# Patient Record
Sex: Female | Born: 1995 | Race: Black or African American | Hispanic: No | Marital: Single | Smoking: Never smoker
Health system: Southern US, Community
[De-identification: ages and names within clinical notes are randomized; demographics above are authoritative.]

---

## 2015-03-26 ENCOUNTER — Encounter: Payer: Self-pay | Admitting: Adult Health

## 2015-04-08 ENCOUNTER — Encounter: Payer: Self-pay | Admitting: Adult Health

## 2016-02-11 ENCOUNTER — Emergency Department (HOSPITAL_COMMUNITY): Admission: EM | Admit: 2016-02-11 | Discharge: 2016-02-11 | Discharge status: Absent without leave | Payer: Self-pay

## 2016-03-13 ENCOUNTER — Emergency Department (HOSPITAL_COMMUNITY)
Admission: EM | Admit: 2016-03-13 | Discharge: 2016-03-13 | Disposition: A | Discharge status: Home / Self Care | Payer: Medicaid Other | Attending: Emergency Medicine | Admitting: Emergency Medicine

## 2016-03-13 ENCOUNTER — Encounter (HOSPITAL_COMMUNITY): Payer: Self-pay

## 2016-03-13 DIAGNOSIS — Z5321 Procedure and treatment not carried out due to patient leaving prior to being seen by health care provider: Secondary | ICD-10-CM | POA: Insufficient documentation

## 2016-03-13 DIAGNOSIS — R112 Nausea with vomiting, unspecified: Secondary | ICD-10-CM | POA: Insufficient documentation

## 2016-03-13 DIAGNOSIS — M545 Low back pain: Secondary | ICD-10-CM | POA: Diagnosis present

## 2016-03-13 LAB — COMPREHENSIVE METABOLIC PANEL
ALT: 16 U/L (ref 14–54)
ANION GAP: 6 (ref 5–15)
AST: 18 U/L (ref 15–41)
Albumin: 4 g/dL (ref 3.5–5.0)
Alkaline Phosphatase: 63 U/L (ref 38–126)
BILIRUBIN TOTAL: 0.6 mg/dL (ref 0.3–1.2)
BUN: 11 mg/dL (ref 6–20)
CHLORIDE: 108 mmol/L (ref 101–111)
CO2: 24 mmol/L (ref 22–32)
Calcium: 9.3 mg/dL (ref 8.9–10.3)
Creatinine, Ser: 0.71 mg/dL (ref 0.44–1.00)
Glucose, Bld: 92 mg/dL (ref 65–99)
POTASSIUM: 3.9 mmol/L (ref 3.5–5.1)
Sodium: 138 mmol/L (ref 135–145)
TOTAL PROTEIN: 7 g/dL (ref 6.5–8.1)

## 2016-03-13 LAB — URINALYSIS, ROUTINE W REFLEX MICROSCOPIC
Glucose, UA: NEGATIVE mg/dL
Hgb urine dipstick: NEGATIVE
KETONES UR: NEGATIVE mg/dL
LEUKOCYTES UA: NEGATIVE
NITRITE: NEGATIVE
PH: 5.5 (ref 5.0–8.0)
PROTEIN: NEGATIVE mg/dL
Specific Gravity, Urine: 1.038 — ABNORMAL HIGH (ref 1.005–1.030)

## 2016-03-13 LAB — CBC
HEMATOCRIT: 39.7 % (ref 36.0–46.0)
Hemoglobin: 12.6 g/dL (ref 12.0–15.0)
MCH: 28.4 pg (ref 26.0–34.0)
MCHC: 31.7 g/dL (ref 30.0–36.0)
MCV: 89.4 fL (ref 78.0–100.0)
Platelets: 334 10*3/uL (ref 150–400)
RBC: 4.44 MIL/uL (ref 3.87–5.11)
RDW: 13.7 % (ref 11.5–15.5)
WBC: 8 10*3/uL (ref 4.0–10.5)

## 2016-03-13 LAB — I-STAT BETA HCG BLOOD, ED (MC, WL, AP ONLY): I-stat hCG, quantitative: <5 m[IU]/mL (ref <5)

## 2016-03-13 LAB — LIPASE, BLOOD: LIPASE: 33 U/L (ref 11–51)

## 2016-03-13 MED ORDER — ONDANSETRON 4 MG PO TBDP
ORAL_TABLET | ORAL | Status: AC
  Filled 2016-03-13: qty 1

## 2016-03-13 MED ORDER — ONDANSETRON 4 MG PO TBDP
4.0000 mg | ORAL_TABLET | Freq: Once | ORAL | Status: AC | PRN
  Administered 2016-03-13: 4 mg via ORAL

## 2016-03-13 NOTE — ED Notes (Signed)
Patient called 3x no response.  

## 2016-03-13 NOTE — ED Triage Notes (Signed)
Pt c/o lower back pain that has been ongoing for several weeks pt recently stopped birth control unsure of pregnancy status pt also c/o some nausea pt also c/o increased gas and some diarrhea

## 2016-03-13 NOTE — ED Notes (Signed)
Patient called 3x, no response °

## 2016-06-21 ENCOUNTER — Encounter (HOSPITAL_COMMUNITY): Payer: Self-pay | Admitting: Emergency Medicine

## 2016-06-21 ENCOUNTER — Emergency Department (HOSPITAL_COMMUNITY)
Admission: EM | Admit: 2016-06-21 | Discharge: 2016-06-22 | Disposition: A | Discharge status: Home / Self Care | Payer: Self-pay | Attending: Emergency Medicine | Admitting: Emergency Medicine

## 2016-06-21 DIAGNOSIS — K5900 Constipation, unspecified: Secondary | ICD-10-CM | POA: Insufficient documentation

## 2016-06-21 DIAGNOSIS — N9489 Other specified conditions associated with female genital organs and menstrual cycle: Secondary | ICD-10-CM | POA: Insufficient documentation

## 2016-06-21 LAB — COMPREHENSIVE METABOLIC PANEL
ALT: 15 U/L (ref 14–54)
AST: 17 U/L (ref 15–41)
Albumin: 3.9 g/dL (ref 3.5–5.0)
Alkaline Phosphatase: 71 U/L (ref 38–126)
Anion gap: 8 (ref 5–15)
BILIRUBIN TOTAL: 0.4 mg/dL (ref 0.3–1.2)
BUN: 8 mg/dL (ref 6–20)
CHLORIDE: 103 mmol/L (ref 101–111)
CO2: 26 mmol/L (ref 22–32)
CREATININE: 0.8 mg/dL (ref 0.44–1.00)
Calcium: 9.5 mg/dL (ref 8.9–10.3)
GFR calc Af Amer: >60 mL/min (ref >60)
Glucose, Bld: 77 mg/dL (ref 65–99)
Potassium: 4.1 mmol/L (ref 3.5–5.1)
Sodium: 137 mmol/L (ref 135–145)
TOTAL PROTEIN: 6.9 g/dL (ref 6.5–8.1)

## 2016-06-21 LAB — URINALYSIS, ROUTINE W REFLEX MICROSCOPIC
BILIRUBIN URINE: NEGATIVE
GLUCOSE, UA: NEGATIVE mg/dL
Hgb urine dipstick: NEGATIVE
KETONES UR: NEGATIVE mg/dL
LEUKOCYTES UA: NEGATIVE
Nitrite: NEGATIVE
PROTEIN: NEGATIVE mg/dL
Specific Gravity, Urine: 1.005 (ref 1.005–1.030)
pH: 7 (ref 5.0–8.0)

## 2016-06-21 LAB — LIPASE, BLOOD: Lipase: 27 U/L (ref 11–51)

## 2016-06-21 LAB — HCG, QUANTITATIVE, PREGNANCY: hCG, Beta Chain, Quant, S: <1 m[IU]/mL (ref <5)

## 2016-06-21 LAB — CBC
HCT: 41.9 % (ref 36.0–46.0)
Hemoglobin: 13.6 g/dL (ref 12.0–15.0)
MCH: 28.3 pg (ref 26.0–34.0)
MCHC: 32.5 g/dL (ref 30.0–36.0)
MCV: 87.3 fL (ref 78.0–100.0)
PLATELETS: 393 10*3/uL (ref 150–400)
RBC: 4.8 MIL/uL (ref 3.87–5.11)
RDW: 13.1 % (ref 11.5–15.5)
WBC: 6.7 10*3/uL (ref 4.0–10.5)

## 2016-06-21 NOTE — ED Triage Notes (Signed)
Patient reports left lateral abdominal pain with nausea onset 2 days ago , pain increases when lying on affected side , denies diarrhea or fever.

## 2016-06-22 ENCOUNTER — Emergency Department (HOSPITAL_COMMUNITY): Payer: Self-pay

## 2016-06-22 MED ORDER — POLYETHYLENE GLYCOL 3350 17 G PO PACK: 17.0000 g | PACK | Freq: Every day | ORAL | 0 refills | Status: AC

## 2016-06-22 MED ORDER — OXYCODONE-ACETAMINOPHEN 5-325 MG PO TABS
1.0000 | ORAL_TABLET | Freq: Once | ORAL | Status: AC
  Administered 2016-06-22: 1 via ORAL
  Filled 2016-06-22: qty 1

## 2016-06-22 MED ORDER — ONDANSETRON 4 MG PO TBDP
4.0000 mg | ORAL_TABLET | Freq: Once | ORAL | Status: AC
  Administered 2016-06-22: 4 mg via ORAL
  Filled 2016-06-22: qty 1

## 2016-06-22 NOTE — ED Notes (Signed)
Pt verbalized understanding of d/c instructions and has no further questions. Pt is stable, A&Ox4, VSS.  

## 2016-06-22 NOTE — ED Provider Notes (Signed)
[facility]-EMERGENCY DEPT Provider Note   CSN: [id number] Arrival date & time: 06/21/16  1925  History   Chief Complaint Chief Complaint  Patient presents with  . Abdominal Pain    HPI Vickie Berry is a 20 y.o. female.  HPI   Patient who is otherwise healthy comes to the ER with complaints of left side pain. She has not been having normal bowel movements and recently started working out. She says that her side pain has been persisting for a few months. She came in today because she was laying down and found it to be uncomfortable to lay on and wanted to be evalauted. She has not had V/D fever, dysuria, vaginal discharge back pain, headache. No other associated symptoms aside from nausea.  History reviewed. No pertinent past medical history.  There are no active problems to display for this patient.   History reviewed. No pertinent surgical history.  OB History    No data available       Home Medications    Prior to Admission medications   Medication Sig Start Date End Date Taking? Authorizing Provider  polyethylene glycol (MIRALAX / GLYCOLAX) packet Take 17 g by mouth daily. 06/22/16   Marlon Peliffany Floride Hutmacher, PA-C    Family History No family history on file.  Social History Social History  Substance Use Topics  . Smoking status: Never Smoker  . Smokeless tobacco: Never Used  . Alcohol use No     Allergies   Patient has no known allergies.   Review of Systems Review of Systems  Review of Systems All other systems negative except as documented in the HPI. All pertinent positives and negatives as reviewed in the HPI.  Physical Exam Updated Vital Signs BP 124/71   Pulse 67   Temp 98.5 F (36.9 C) (Oral)   Resp 16   Ht 5\' 9"  (1.753 m)   Wt 95.3 kg   LMP 05/14/2016 (Approximate)   SpO2 100%   BMI 31.01 kg/m   Physical Exam  Constitutional: She appears well-developed and well-nourished.  HENT:  Head: Normocephalic and atraumatic.  Eyes: Conjunctivae are  normal. Pupils are equal, round, and reactive to light.  Neck: Trachea normal, normal range of motion and full passive range of motion without pain. Neck supple.  Cardiovascular: Normal rate, regular rhythm and normal pulses.   Pulmonary/Chest: Effort normal and breath sounds normal. Chest wall is not dull to percussion. She exhibits no tenderness, no crepitus, no edema, no deformity and no retraction.  Abdominal: Soft. Normal appearance and bowel sounds are normal. She exhibits no distension and no ascites. There is generalized tenderness (mild). There is no rigidity, no guarding, no tenderness at McBurney's point and negative Murphy's sign.    Musculoskeletal: Normal range of motion.  Neurological: She is alert. She has normal strength.  Skin: Skin is warm, dry and intact.  Psychiatric: She has a normal mood and affect. Her speech is normal and behavior is normal. Judgment and thought content normal. Cognition and memory are normal.    ED Treatments / Results  Labs (all labs ordered are listed, but only abnormal results are displayed) Labs Reviewed  LIPASE, BLOOD  COMPREHENSIVE METABOLIC PANEL  CBC  URINALYSIS, ROUTINE W REFLEX MICROSCOPIC  HCG, QUANTITATIVE, PREGNANCY    EKG  EKG Interpretation None       Radiology Dg Abdomen 1 View  Result Date: 06/22/2016 CLINICAL DATA:  Subacute onset of generalized abdominal pain. Initial encounter. EXAM: ABDOMEN - 1 VIEW COMPARISON:  None.  FINDINGS: The visualized bowel gas pattern is unremarkable. Scattered air and stool filled loops of colon are seen; no abnormal dilatation of small bowel loops is seen to suggest small bowel obstruction. No free intra-abdominal air is identified, though evaluation for free air is limited on a single supine view. The visualized osseous structures are within normal limits; the sacroiliac joints are unremarkable in appearance. I  MPRESSION: Unremarkable bowel gas pattern; no free intra-abdominal air seen.  Moderate amount of stool noted in the colon. Electronically Signed   By: Roanna RaiderJeffery  Chang M.D.   On: 06/22/2016 01:12    Procedures Procedures (including critical care time)  Medications Ordered in ED Medications  ondansetron (ZOFRAN-ODT) disintegrating tablet 4 mg (4 mg Oral Given 06/22/16 0034)  oxyCODONE-acetaminophen (PERCOCET/ROXICET) 5-325 MG per tablet 1 tablet (1 tablet Oral Given 06/22/16 0043)     Initial Impression / Assessment and Plan / ED Course  I have reviewed the triage vital signs and the nursing notes.  Pertinent labs & imaging results that were available during my care of the patient were reviewed by me and considered in my medical decision making (see chart for details).  Clinical Course     Patient is nontoxic, nonseptic appearing, in no apparent distress.  Patient's pain and other symptoms adequately managed in emergency department.  Fluid bolus given.  Labs, imaging and vitals reviewed.  Patient does not meet the SIRS or Sepsis criteria.  On repeat exam patient does not have a surgical abdomin and there are no peritoneal signs.  No indication of appendicitis, bowel obstruction, bowel perforation, cholecystitis, diverticulitis,ectopic pregnancy.  Patient discharged home with symptomatic treatment and given strict instructions for follow-up with their primary care physician.  I have also discussed reasons to return immediately to the ER.  Patient expresses understanding and agrees with plan.   Final Clinical Impressions(s) / ED Diagnoses   Final diagnoses:  Constipation, unspecified constipation type    New Prescriptions New Prescriptions   POLYETHYLENE GLYCOL (MIRALAX / GLYCOLAX) PACKET    Take 17 g by mouth daily.     Marlon Peliffany Jaiyla Granados, PA-C 06/22/16 [id number]    Azalia BilisKevin Campos, MD 06/22/16 [phone]

## 2017-05-01 IMAGING — DX DG ABDOMEN 1V
1 series · 1 of 1 positions shown · non-contrast
Comparison: None.

CLINICAL DATA: Subacute onset of generalized abdominal pain.
Initial encounter.

EXAM:
ABDOMEN - 1 VIEW

[abdomen kub]
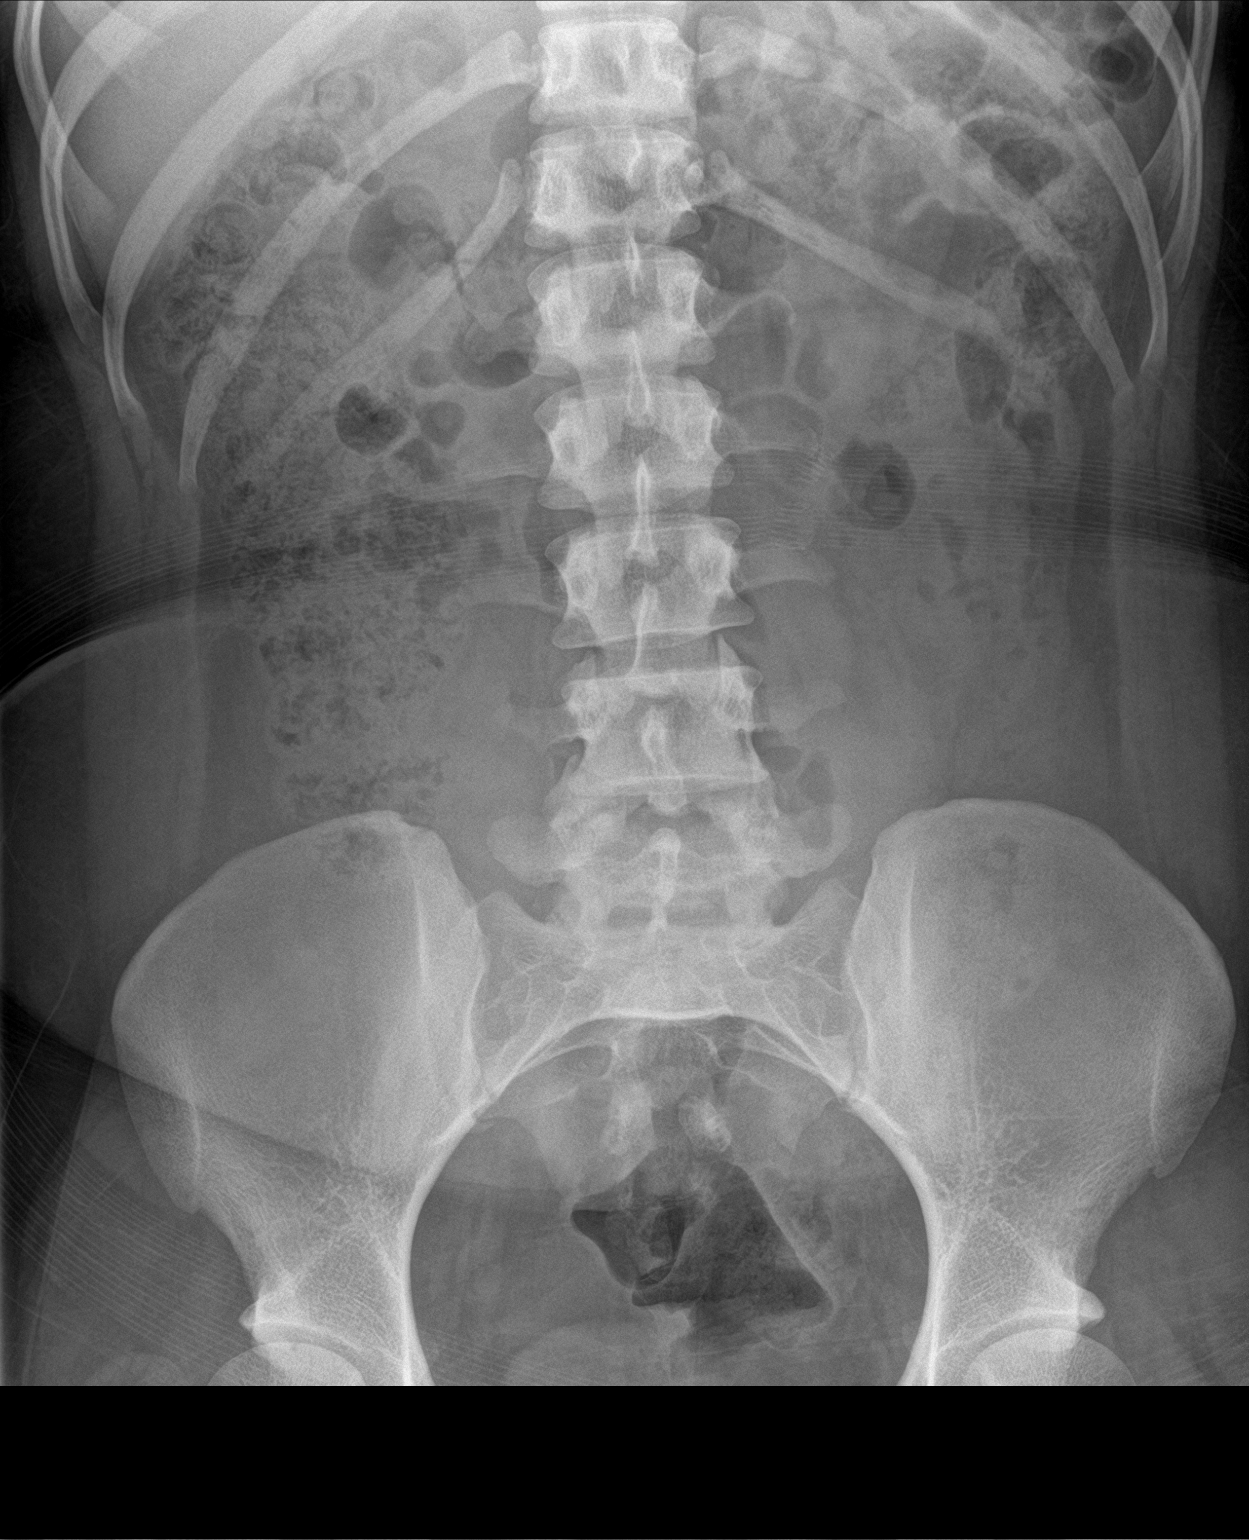

[1 of 1 positions shown; findings below may reference images not displayed]

FINDINGS: The visualized bowel gas pattern is unremarkable. Scattered air and
stool filled loops of colon are seen; no abnormal dilatation of
small bowel loops is seen to suggest small bowel obstruction. No
free intra-abdominal air is identified, though evaluation for free
air is limited on a single supine view.

The visualized osseous structures are within normal limits; the
sacroiliac joints are unremarkable in appearance.
IMPRESSION: Unremarkable bowel gas pattern; no free intra-abdominal air seen.
Moderate amount of stool noted in the colon.
